# Patient Record
Sex: Male | Born: 1985 | Race: White | Hispanic: No | Marital: Single | Smoking: Never smoker
Health system: Southern US, Community
[De-identification: ages and names within clinical notes are randomized; demographics above are authoritative.]

---

## 2009-11-22 ENCOUNTER — Emergency Department (HOSPITAL_COMMUNITY): Admission: EM | Admit: 2009-11-22 | Discharge: 2009-11-22 | Payer: Self-pay | Admitting: Emergency Medicine

## 2010-01-04 ENCOUNTER — Emergency Department (HOSPITAL_COMMUNITY): Admission: EM | Admit: 2010-01-04 | Discharge: 2010-01-04 | Payer: Self-pay | Admitting: Emergency Medicine

## 2010-08-31 ENCOUNTER — Emergency Department (HOSPITAL_COMMUNITY)
Admission: EM | Admit: 2010-08-31 | Discharge: 2010-08-31 | Payer: Self-pay | Source: Home / Self Care | Admitting: Emergency Medicine

## 2011-02-20 ENCOUNTER — Emergency Department (HOSPITAL_COMMUNITY): Payer: Medicaid - Out of State

## 2011-02-20 ENCOUNTER — Emergency Department (HOSPITAL_COMMUNITY)
Admission: EM | Admit: 2011-02-20 | Discharge: 2011-02-21 | Disposition: A | Discharge status: Home / Self Care | Payer: Medicaid - Out of State | Attending: Emergency Medicine | Admitting: Emergency Medicine

## 2011-02-20 DIAGNOSIS — Z79899 Other long term (current) drug therapy: Secondary | ICD-10-CM | POA: Insufficient documentation

## 2011-02-20 DIAGNOSIS — E7800 Pure hypercholesterolemia, unspecified: Secondary | ICD-10-CM | POA: Insufficient documentation

## 2011-02-20 DIAGNOSIS — R0602 Shortness of breath: Secondary | ICD-10-CM | POA: Insufficient documentation

## 2011-02-20 DIAGNOSIS — R079 Chest pain, unspecified: Secondary | ICD-10-CM | POA: Insufficient documentation

## 2011-02-20 DIAGNOSIS — R109 Unspecified abdominal pain: Secondary | ICD-10-CM | POA: Insufficient documentation

## 2011-02-20 DIAGNOSIS — K219 Gastro-esophageal reflux disease without esophagitis: Secondary | ICD-10-CM | POA: Insufficient documentation

## 2011-02-20 DIAGNOSIS — I10 Essential (primary) hypertension: Secondary | ICD-10-CM | POA: Insufficient documentation

## 2011-02-20 DIAGNOSIS — H539 Unspecified visual disturbance: Secondary | ICD-10-CM | POA: Insufficient documentation

## 2011-02-20 LAB — POCT CARDIAC MARKERS: Myoglobin, poc: 70.4 ng/mL (ref 12–200)

## 2011-02-20 LAB — CBC
Hemoglobin: 15.6 g/dL (ref 13.0–17.0)
MCH: 28.2 pg (ref 26.0–34.0)
MCHC: 33 g/dL (ref 30.0–36.0)
Platelets: 309 10*3/uL (ref 150–400)

## 2011-02-21 LAB — BASIC METABOLIC PANEL
BUN: 7 mg/dL (ref 6–23)
CO2: 30 mEq/L (ref 19–32)
Calcium: 10.5 mg/dL (ref 8.4–10.5)
Chloride: 98 mEq/L (ref 96–112)
Creatinine, Ser: 0.85 mg/dL (ref 0.4–1.5)
GFR calc Af Amer: >60 mL/min (ref >60)
Glucose, Bld: 96 mg/dL (ref 70–99)

## 2012-12-24 ENCOUNTER — Emergency Department (INDEPENDENT_AMBULATORY_CARE_PROVIDER_SITE_OTHER): Payer: Medicaid - Out of State

## 2012-12-24 ENCOUNTER — Encounter (HOSPITAL_COMMUNITY): Payer: Self-pay | Admitting: *Deleted

## 2012-12-24 ENCOUNTER — Emergency Department (INDEPENDENT_AMBULATORY_CARE_PROVIDER_SITE_OTHER)
Admission: EM | Admit: 2012-12-24 | Discharge: 2012-12-24 | Disposition: A | Discharge status: Home / Self Care | Payer: Medicaid - Out of State | Source: Home / Self Care

## 2012-12-24 DIAGNOSIS — R918 Other nonspecific abnormal finding of lung field: Secondary | ICD-10-CM

## 2012-12-24 DIAGNOSIS — R05 Cough: Secondary | ICD-10-CM

## 2012-12-24 MED ORDER — AZITHROMYCIN 250 MG PO TABS: 250.0000 mg | ORAL_TABLET | Freq: Every day | ORAL | Status: AC

## 2012-12-24 MED ORDER — PHENYLEPHRINE-CHLORPHEN-DM 10-4-12.5 MG/5ML PO LIQD: 5.0000 mL | ORAL | Status: AC | PRN

## 2012-12-24 NOTE — ED Provider Notes (Signed)
History     CSN: [id number]  Arrival date & time 12/24/12  1352   First MD Initiated Contact with Patient 12/24/12 1505      Chief Complaint  Patient presents with  . Cough    (Consider location/radiation/quality/duration/timing/severity/associated sxs/prior treatment) HPI Comments: 27 year old male states that he had a URI to 3 weeks ago. He states most of the symptoms abated however his cough has not. He was told at that time when he went to the hospital that he had drainage and a cough presumptively from bronchospasm. His diagnosis was bronchitis. This information from the patient and their documentation for this visit was found in his record. He was given a cough medication and albuterol HFA. Another medicine was given but he cannot recall what it was for or the name of it. His chief complaint is that of persistent cough. He denies sore throat, fever, chills, earache or PND. He does not smoke. He has been using the albuterol several times during the day for his cough but states it does not help him. The cough medicine which helped her short time but it would return within 3-4 hours.   History reviewed. No pertinent past medical history.  History reviewed. No pertinent past surgical history.  Family History  Problem Relation Age of Onset  . Family history unknown: Yes    History  Substance Use Topics  . Smoking status: Never Smoker   . Smokeless tobacco: Not on file  . Alcohol Use: Yes     Comment: social      Review of Systems  Constitutional: Positive for activity change. Negative for fever, diaphoresis and fatigue.  HENT: Negative for ear pain, sore throat, facial swelling, rhinorrhea, mouth sores, trouble swallowing, neck pain, neck stiffness, postnasal drip and sinus pressure.   Eyes: Negative for pain, discharge and redness.  Respiratory: Positive for cough. Negative for chest tightness, shortness of breath and wheezing.   Cardiovascular: Negative.    Gastrointestinal: Negative.   Genitourinary: Negative.   Musculoskeletal: Negative.   Neurological: Negative.     Allergies  Review of patient's allergies indicates no known allergies.  Home Medications  No current outpatient prescriptions on file.  BP 136/78  Pulse 88  Temp(Src) 98.2 F (36.8 C) (Oral)  Resp 20  SpO2 98%  Physical Exam  Nursing note and vitals reviewed. Constitutional: He is oriented to person, place, and time. He appears well-developed and well-nourished. No distress.  HENT:  Right TM is cured by wax. The TM with mild erythema and dullness. Oropharynx with erythema and streaky fashion with clear PND and no exudates.  Eyes: Conjunctivae and EOM are normal.  Neck: Normal range of motion. Neck supple.  Cardiovascular: Normal rate, regular rhythm and normal heart sounds.   Pulmonary/Chest: Effort normal. No respiratory distress.  By basilar crackles and coarseness. No upper airway wheezing.  Abdominal: Soft. There is no tenderness.  Musculoskeletal: Normal range of motion. He exhibits no edema.  Lymphadenopathy:    He has no cervical adenopathy.  Neurological: He is alert and oriented to person, place, and time.  Skin: Skin is warm and dry. No rash noted.  Psychiatric: He has a normal mood and affect.    ED Course  Procedures (including critical care time)  Labs Reviewed - No data to display No results found.   No diagnosis found. No information on file. Dg Chest 2 View  12/24/2012  *RADIOLOGY REPORT*  Clinical Data: Cough and bibasilar crackles  CHEST - 2 VIEW  Comparison: Feb 20, 2011  Findings:  There is subtle interstitial prominence in the lateral right base, suspicious for a small area of infiltrate.  Lungs elsewhere clear.  Heart size and pulmonary vascularity are normal. No adenopathy.  No bone lesions.  Impression: Small of infiltrate lateral right base.  Lungs otherwise clear.   Original Report Authenticated By: Bretta Bang, M.D.         MDM  Small infiltrate in the right lateral lung base. This may be contributing to his persistent cough. Also contributing is the PND. Treated with a Z-Pak and Norell CS for drainage. May cause drowsiness. For any worsening new symptoms or problems may return strongly suggested to obtain a primary care doctor for followup.          Hayden Rasmussen, NP 12/24/12 1606

## 2012-12-24 NOTE — ED Notes (Signed)
Pt reports dry cough for the past 3 weeks since recovering from URI infection - sometimes is productive with green/white sputum

## 2012-12-24 NOTE — ED Provider Notes (Signed)
Medical screening examination/treatment/procedure(s) were performed by non-physician practitioner and as supervising physician I was immediately available for consultation/collaboration.  Raynald Blend, MD 12/24/12 [phone]

## 2013-10-13 IMAGING — CR DG CHEST 2V
2 series · 2 of 2 positions shown · non-contrast
Comparison: February 20, 2011

CLINICAL DATA: Cough and bibasilar crackles

CHEST - 2 VIEW

[view not recorded (1 of 2)]
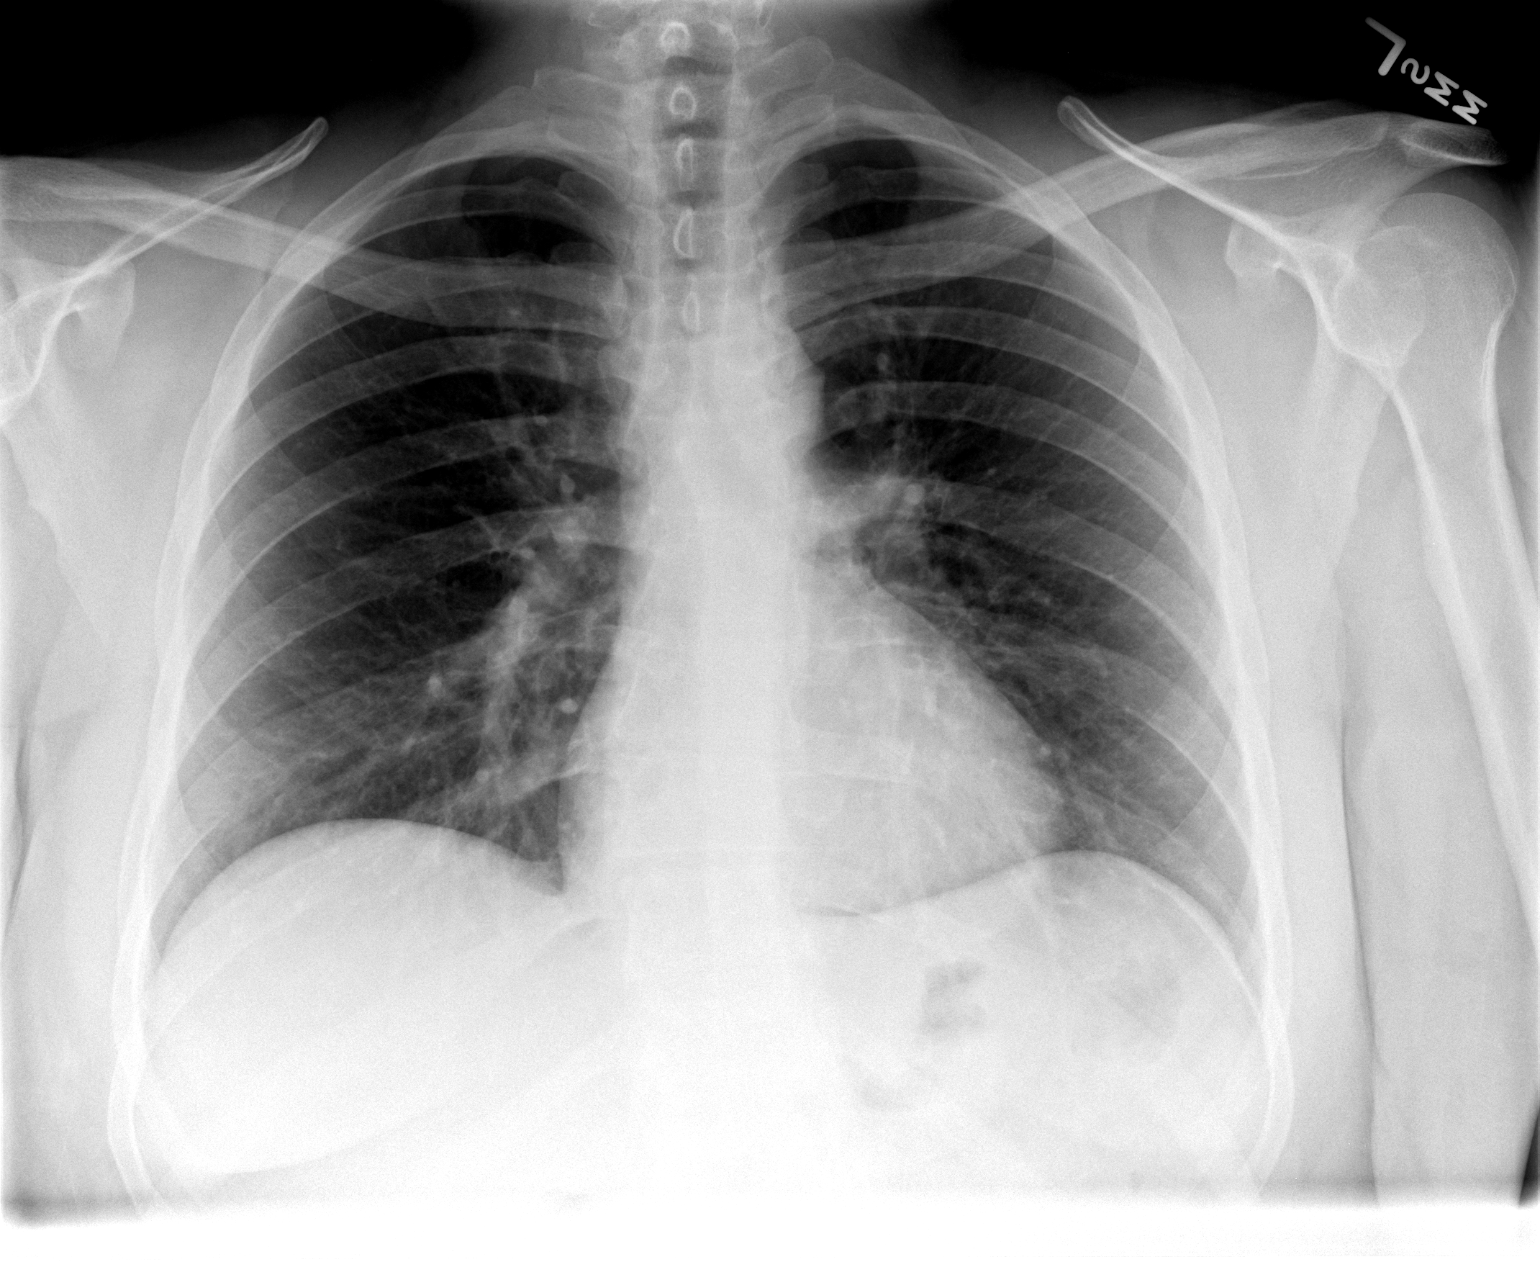

[view not recorded (2 of 2)]
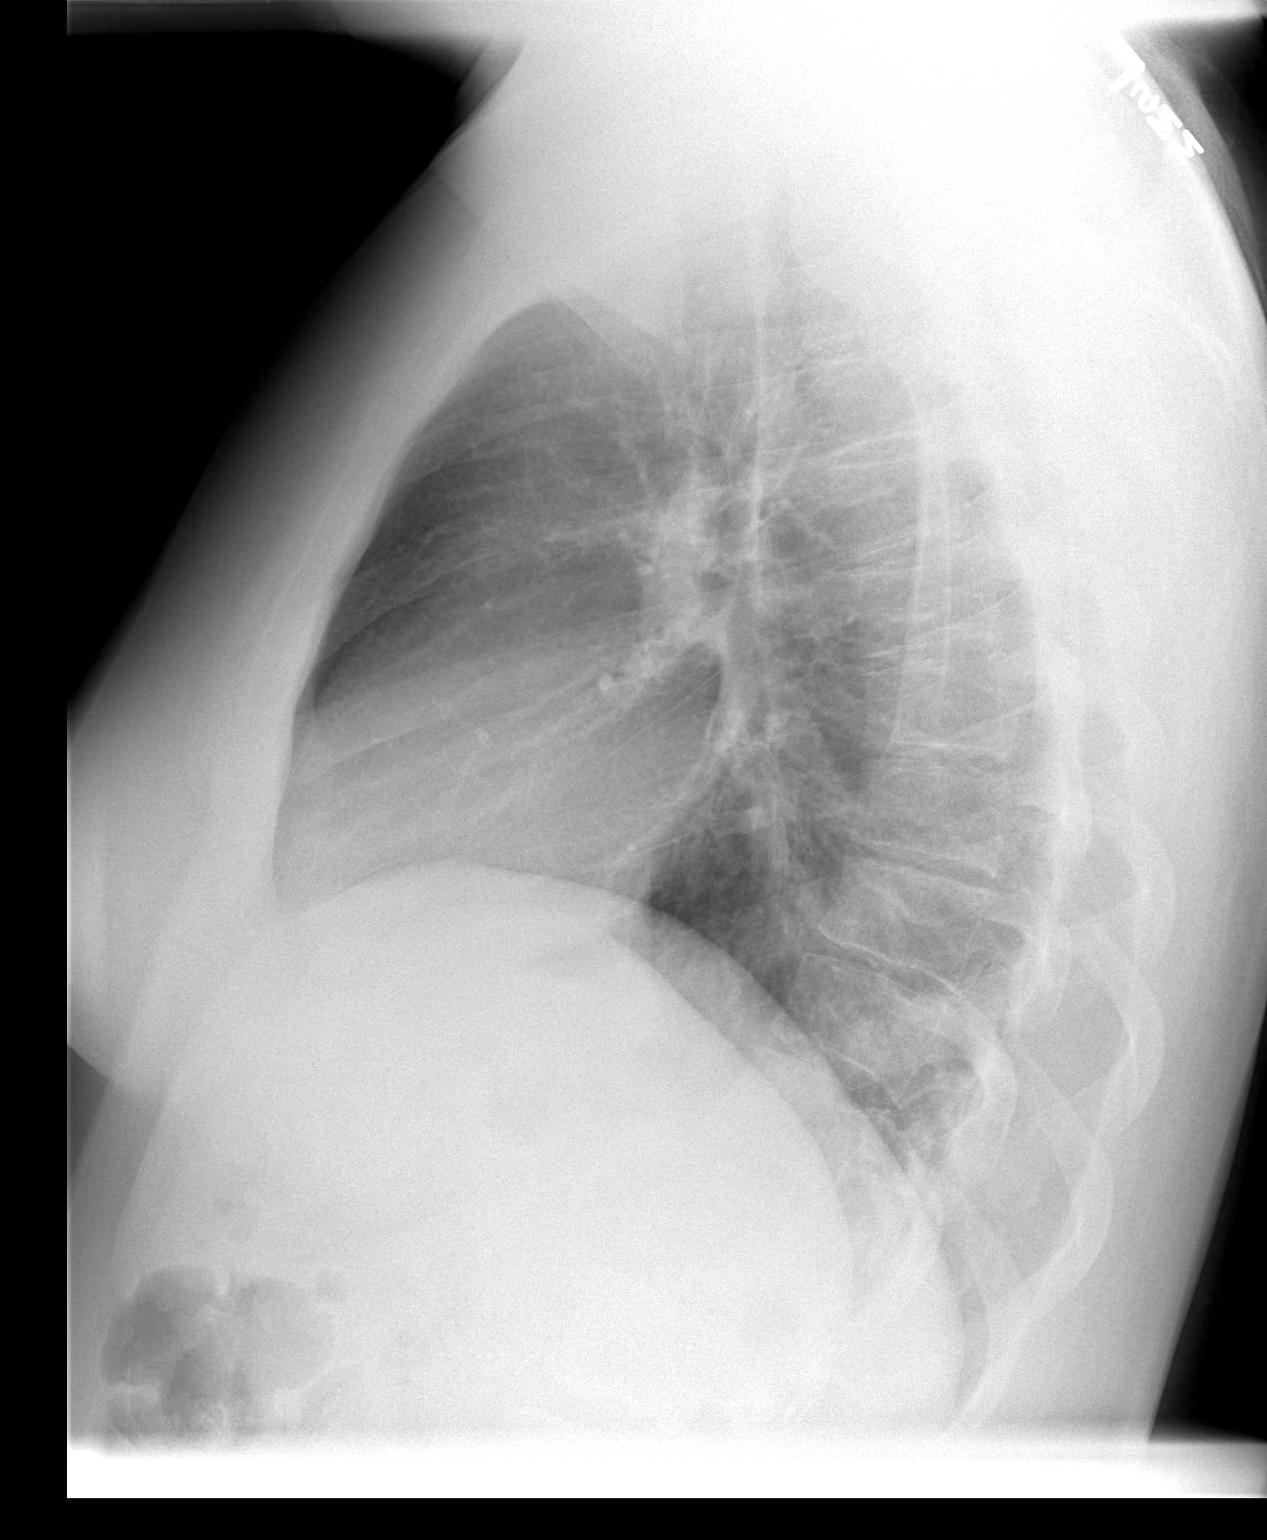

[2 of 2 positions shown; findings below may reference images not displayed]

FINDINGS: There is subtle interstitial prominence in the lateral
right base, suspicious for a small area of infiltrate.  Lungs
elsewhere clear.  Heart size and pulmonary vascularity are normal.
No adenopathy.  No bone lesions.
IMPRESSION: Small of infiltrate lateral right base.  Lungs
otherwise clear.

## 2021-07-07 ENCOUNTER — Other Ambulatory Visit: Payer: Self-pay

## 2021-07-07 ENCOUNTER — Emergency Department (HOSPITAL_COMMUNITY)
Admission: EM | Admit: 2021-07-07 | Discharge: 2021-07-07 | Disposition: A | Discharge status: Home / Self Care | Payer: Medicaid - Out of State | Attending: Emergency Medicine | Admitting: Emergency Medicine

## 2021-07-07 ENCOUNTER — Encounter (HOSPITAL_COMMUNITY): Payer: Self-pay | Admitting: Emergency Medicine

## 2021-07-07 DIAGNOSIS — L259 Unspecified contact dermatitis, unspecified cause: Secondary | ICD-10-CM | POA: Diagnosis not present

## 2021-07-07 DIAGNOSIS — R21 Rash and other nonspecific skin eruption: Secondary | ICD-10-CM | POA: Diagnosis present

## 2021-07-07 NOTE — Discharge Instructions (Addendum)
Exam was reassuring, suspect you have contact dermatitis.  Appears to be healing well, please keep the area clean.  For itchiness I recommend Claritin daily as this will help with itchiness.  You also apply hydrocortisone cream which is found over-the-counter at your local drug store.  Please follow-up with PCP as needed.  Come back to the emergency department if you develop chest pain, shortness of breath, severe abdominal pain, uncontrolled nausea, vomiting, diarrhea.

## 2021-07-07 NOTE — ED Triage Notes (Signed)
Pt c/o rash to bilateral lower legs x3-4 days.

## 2021-07-07 NOTE — ED Provider Notes (Signed)
[facility] EMERGENCY DEPARTMENT Provider Note   CSN: [id number] Arrival date & time: 07/07/21  1616     History Chief Complaint  Patient presents with   Rash    Douglas Ross is a 35 y.o. male.  HPI  Patient with no significant medical history presents to the emergency department with chief complaint of a rash.  Patient states he noted a rash on his left ankle approximately 3 days ago.  States it came on suddenly, states it started off as a bumps which were itchy, states he started to pick at it and now he has some scabs over the area.  He states that the area is still itchy and will burn if he scratches at it.  He denies any other rash denies tongue, throat, lip swelling, difficulty breathing, denies  GI symptoms.  He is unsure if he touched poison ivy or poison oak, states he never had this in the past.  He is not immunocompromise, has no other complaints.  Does not endorse fevers, chills, chest pain, shortness of breath, worsening pedal edema.  History reviewed. No pertinent past medical history.  There are no problems to display for this patient.   History reviewed. No pertinent surgical history.     Family History  Family history unknown: Yes    Social History   Tobacco Use   Smoking status: Never  Substance Use Topics   Alcohol use: Yes    Comment: social   Drug use: No    Home Medications Prior to Admission medications   Medication Sig Start Date End Date Taking? Authorizing Provider  azithromycin (ZITHROMAX) 250 MG tablet Take 1 tablet (250 mg total) by mouth daily. 2 tabs po on day 1, 1 tab po on days 2-5 12/24/12   Hayden Rasmussen, NP  Phenylephrine-Chlorphen-DM 07-06-11.5 MG/5ML LIQD Take 5 mLs by mouth every 4 (four) hours as needed. 12/24/12   Hayden Rasmussen, NP    Allergies    Patient has no known allergies.  Review of Systems   Review of Systems  Constitutional:  Negative for chills and fever.  HENT:  Negative for congestion.   Respiratory:  Negative  for shortness of breath.   Cardiovascular:  Negative for chest pain.  Gastrointestinal:  Negative for abdominal pain.  Genitourinary:  Negative for enuresis.  Musculoskeletal:  Negative for back pain.  Skin:  Positive for rash.  Neurological:  Negative for dizziness.  Hematological:  Does not bruise/bleed easily.   Physical Exam Updated Vital Signs BP (!) 131/92 (BP Location: Right Arm)   Pulse 71   Temp 97.6 F (36.4 C) (Oral)   Resp 18   Ht 6\' 1"  (1.854 m)   Wt 122.8 kg   SpO2 100%   BMI 35.73 kg/m   Physical Exam Vitals and nursing note reviewed.  Constitutional:      General: He is not in acute distress.    Appearance: He is not ill-appearing.  HENT:     Head: Normocephalic and atraumatic.     Nose: No congestion.     Mouth/Throat:     Mouth: Mucous membranes are moist.     Pharynx: Oropharynx is clear. No oropharyngeal exudate or posterior oropharyngeal erythema.     Comments: Oropharynx visualized tongue and uvula are both midline, controlling oral secretions, tonsils were symmetrical and equal in size, no elevation in tongue, no trismus or torticollis noted. Eyes:     Conjunctiva/sclera: Conjunctivae normal.  Cardiovascular:     Rate and Rhythm:  Normal rate and regular rhythm.     Pulses: Normal pulses.     Heart sounds: No murmur heard.   No friction rub. No gallop.  Pulmonary:     Effort: No respiratory distress.     Breath sounds: No wheezing, rhonchi or rales.  Skin:    General: Skin is warm and dry.     Comments: Patient has small abrasions on the medial aspect of his distal tibia, it had slight surrounding erythema, no drainage or discharge present, area is not warm to the touch, no fluctuance or induration noted.  There is no other rash present on the patient's body, not on the soles or palms, no oral lesions present.  Neurological:     Mental Status: He is alert.  Psychiatric:        Mood and Affect: Mood normal.    ED Results / Procedures /  Treatments   Labs (all labs ordered are listed, but only abnormal results are displayed) Labs Reviewed - No data to display  EKG None  Radiology No results found.  Procedures Procedures   Medications Ordered in ED Medications - No data to display  ED Course  I have reviewed the triage vital signs and the nursing notes.  Pertinent labs & imaging results that were available during my care of the patient were reviewed by me and considered in my medical decision making (see chart for details).    MDM Rules/Calculators/A&P                          Initial impression-patient presents with rash on his left leg.  He is alert, does not appear in acute stress, vital signs reassuring.  Work-up-due to well-appearing patient, benign physical exam, further lab work imaging not warranted at this time.  Rule out-low suspicion for anaphylactic shock as patient is nontoxic-appearing, vital signs reassuring, no systemic rash, no GI symptoms.  Low suspicion for TENS or Stevens-Johnson's there is no noted skin sloughing, no oral lesions present.  Low suspicion for tickborne illness presentation atypical etiology.  Low suspicion for cellulitis and/or deep tissue infection as no signs of infection present during my exam.  Low suspicion for shingles as rash deviates the midline, presentation is atypical.  Plan-   Rash--likely patient is suffering from contact dermatitis, appears to be healing well, will recommend basic wound care, H1 H2 blockers, topical steroids follow-up with PCP as needed.   Vital signs have remained stable, no indication for hospital admission. Patient given at home care as well strict return precautions.  Patient verbalized that they understood agreed to said plan.  Final Clinical Impression(s) / ED Diagnoses Final diagnoses:  Contact dermatitis, unspecified contact dermatitis type, unspecified trigger    Rx / DC Orders ED Discharge Orders     None        Carroll Sage, PA-C 07/07/21 1931    Pricilla Loveless, MD 07/12/21 [phone]
